# Patient Record
Sex: Male | Born: 1995 | Race: White | Hispanic: No | Marital: Married | Smoking: Never smoker
Health system: Southern US, Community
[De-identification: ages and names within clinical notes are randomized; demographics above are authoritative.]

## PROBLEM LIST (undated history)

## (undated) DIAGNOSIS — M419 Scoliosis, unspecified: Secondary | ICD-10-CM

## (undated) DIAGNOSIS — K2970 Gastritis, unspecified, without bleeding: Secondary | ICD-10-CM

---
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
## 2018-01-07 ENCOUNTER — Other Ambulatory Visit: Payer: Self-pay

## 2018-01-07 ENCOUNTER — Encounter (HOSPITAL_COMMUNITY): Payer: Self-pay | Admitting: Emergency Medicine

## 2018-01-07 DIAGNOSIS — Z5321 Procedure and treatment not carried out due to patient leaving prior to being seen by health care provider: Secondary | ICD-10-CM | POA: Insufficient documentation

## 2018-01-07 DIAGNOSIS — R109 Unspecified abdominal pain: Secondary | ICD-10-CM | POA: Insufficient documentation

## 2018-01-07 LAB — URINALYSIS, ROUTINE W REFLEX MICROSCOPIC
Bilirubin Urine: NEGATIVE
Glucose, UA: NEGATIVE mg/dL
Hgb urine dipstick: NEGATIVE
Ketones, ur: NEGATIVE mg/dL
Leukocytes, UA: NEGATIVE
NITRITE: NEGATIVE
PH: 6 (ref 5.0–8.0)
Protein, ur: NEGATIVE mg/dL
SPECIFIC GRAVITY, URINE: 1.023 (ref 1.005–1.030)

## 2018-01-07 LAB — CBC
HEMATOCRIT: 45.3 % (ref 39.0–52.0)
HEMOGLOBIN: 15.4 g/dL (ref 13.0–17.0)
MCH: 29.3 pg (ref 26.0–34.0)
MCHC: 34 g/dL (ref 30.0–36.0)
MCV: 86.3 fL (ref 78.0–100.0)
Platelets: 318 10*3/uL (ref 150–400)
RBC: 5.25 MIL/uL (ref 4.22–5.81)
RDW: 13.1 % (ref 11.5–15.5)
WBC: 9.2 10*3/uL (ref 4.0–10.5)

## 2018-01-07 NOTE — ED Triage Notes (Signed)
Pt reports intermittent abd pain X 1-2 yrs, was seen and dx with gastritis, takes zantac. Pt states this "issue" has flared up over the past week.

## 2018-01-08 ENCOUNTER — Emergency Department (HOSPITAL_COMMUNITY)
Admission: EM | Admit: 2018-01-08 | Discharge: 2018-01-08 | Discharge status: Against Medical Advice | Payer: Self-pay | Attending: Emergency Medicine | Admitting: Emergency Medicine

## 2018-01-08 HISTORY — DX: Gastritis, unspecified, without bleeding: K29.70

## 2018-01-08 HISTORY — DX: Scoliosis, unspecified: M41.9

## 2018-01-08 LAB — COMPREHENSIVE METABOLIC PANEL
ALK PHOS: 65 U/L (ref 38–126)
ALT: 44 U/L (ref 17–63)
ANION GAP: 10 (ref 5–15)
AST: 27 U/L (ref 15–41)
Albumin: 4.6 g/dL (ref 3.5–5.0)
BUN: 14 mg/dL (ref 6–20)
CALCIUM: 9.3 mg/dL (ref 8.9–10.3)
CO2: 24 mmol/L (ref 22–32)
Chloride: 103 mmol/L (ref 101–111)
Creatinine, Ser: 0.99 mg/dL (ref 0.61–1.24)
Glucose, Bld: 105 mg/dL — ABNORMAL HIGH (ref 65–99)
Potassium: 4.1 mmol/L (ref 3.5–5.1)
Sodium: 137 mmol/L (ref 135–145)
Total Bilirubin: 0.8 mg/dL (ref 0.3–1.2)
Total Protein: 8.2 g/dL — ABNORMAL HIGH (ref 6.5–8.1)

## 2018-01-08 LAB — LIPASE, BLOOD: Lipase: 30 U/L (ref 11–51)

## 2018-01-08 NOTE — ED Notes (Signed)
Pt. Called for results of labs.   All questions answered.

## 2018-01-08 NOTE — ED Notes (Signed)
No answer when called for room x 3 

## 2020-03-11 ENCOUNTER — Encounter (HOSPITAL_COMMUNITY): Payer: Self-pay | Admitting: *Deleted

## 2020-03-11 ENCOUNTER — Other Ambulatory Visit: Payer: Self-pay

## 2020-03-11 ENCOUNTER — Emergency Department (HOSPITAL_COMMUNITY)
Admission: EM | Admit: 2020-03-11 | Discharge: 2020-03-11 | Disposition: A | Discharge status: Home / Self Care | Payer: Self-pay | Attending: Emergency Medicine | Admitting: Emergency Medicine

## 2020-03-11 DIAGNOSIS — H9192 Unspecified hearing loss, left ear: Secondary | ICD-10-CM | POA: Insufficient documentation

## 2020-03-11 DIAGNOSIS — Z5321 Procedure and treatment not carried out due to patient leaving prior to being seen by health care provider: Secondary | ICD-10-CM | POA: Insufficient documentation

## 2020-03-11 NOTE — ED Triage Notes (Signed)
The pt had a sudden loss of hearing in his lt ear earlier tonight

## 2020-03-11 NOTE — ED Notes (Signed)
Pt has decided to leave ED, stating that he will "come back later" with another pt. Pt was encouraged to remain in the waiting room in order to see a provider as soon as possible, and he was reminded that his wait time would restart upon the decision to leave the department. Pt acknowledged this and chose to leave.

## 2024-05-03 DIAGNOSIS — F649 Gender identity disorder, unspecified: Secondary | ICD-10-CM | POA: Diagnosis not present

## 2024-05-11 DIAGNOSIS — F649 Gender identity disorder, unspecified: Secondary | ICD-10-CM | POA: Diagnosis not present

## 2024-05-11 DIAGNOSIS — E559 Vitamin D deficiency, unspecified: Secondary | ICD-10-CM | POA: Diagnosis not present

## 2024-05-11 DIAGNOSIS — F900 Attention-deficit hyperactivity disorder, predominantly inattentive type: Secondary | ICD-10-CM | POA: Diagnosis not present

## 2024-06-09 DIAGNOSIS — Z114 Encounter for screening for human immunodeficiency virus [HIV]: Secondary | ICD-10-CM | POA: Diagnosis not present

## 2024-06-09 DIAGNOSIS — F649 Gender identity disorder, unspecified: Secondary | ICD-10-CM | POA: Diagnosis not present

## 2024-06-09 DIAGNOSIS — Z113 Encounter for screening for infections with a predominantly sexual mode of transmission: Secondary | ICD-10-CM | POA: Diagnosis not present

## 2024-06-15 DIAGNOSIS — Z113 Encounter for screening for infections with a predominantly sexual mode of transmission: Secondary | ICD-10-CM | POA: Diagnosis not present

## 2024-06-15 DIAGNOSIS — Z114 Encounter for screening for human immunodeficiency virus [HIV]: Secondary | ICD-10-CM | POA: Diagnosis not present

## 2024-06-15 DIAGNOSIS — A546 Gonococcal infection of anus and rectum: Secondary | ICD-10-CM | POA: Diagnosis not present

## 2024-07-30 DIAGNOSIS — Z131 Encounter for screening for diabetes mellitus: Secondary | ICD-10-CM | POA: Diagnosis not present

## 2024-07-30 DIAGNOSIS — Z209 Contact with and (suspected) exposure to unspecified communicable disease: Secondary | ICD-10-CM | POA: Diagnosis not present

## 2024-07-30 DIAGNOSIS — E663 Overweight: Secondary | ICD-10-CM | POA: Diagnosis not present

## 2024-07-30 DIAGNOSIS — Z13220 Encounter for screening for lipoid disorders: Secondary | ICD-10-CM | POA: Diagnosis not present

## 2024-07-30 DIAGNOSIS — Z7251 High risk heterosexual behavior: Secondary | ICD-10-CM | POA: Diagnosis not present

## 2024-07-30 DIAGNOSIS — Z79890 Hormone replacement therapy (postmenopausal): Secondary | ICD-10-CM | POA: Diagnosis not present

## 2024-10-08 DIAGNOSIS — M545 Low back pain, unspecified: Secondary | ICD-10-CM | POA: Diagnosis not present

## 2024-10-08 DIAGNOSIS — R635 Abnormal weight gain: Secondary | ICD-10-CM | POA: Diagnosis not present

## 2024-10-08 DIAGNOSIS — G8929 Other chronic pain: Secondary | ICD-10-CM | POA: Diagnosis not present

## 2024-10-08 DIAGNOSIS — R293 Abnormal posture: Secondary | ICD-10-CM | POA: Diagnosis not present

## 2024-10-08 DIAGNOSIS — F900 Attention-deficit hyperactivity disorder, predominantly inattentive type: Secondary | ICD-10-CM | POA: Diagnosis not present
